# Patient Record
Sex: Female | Born: 1940 | Race: White | Hispanic: No | Smoking: Former smoker
Health system: Southern US, Community
[De-identification: ages and names within clinical notes are randomized; demographics above are authoritative.]

## PROBLEM LIST (undated history)

## (undated) DIAGNOSIS — M8580 Other specified disorders of bone density and structure, unspecified site: Secondary | ICD-10-CM

## (undated) DIAGNOSIS — Z8719 Personal history of other diseases of the digestive system: Secondary | ICD-10-CM

## (undated) DIAGNOSIS — M810 Age-related osteoporosis without current pathological fracture: Secondary | ICD-10-CM

## (undated) DIAGNOSIS — Z87440 Personal history of urinary (tract) infections: Secondary | ICD-10-CM

## (undated) DIAGNOSIS — J189 Pneumonia, unspecified organism: Secondary | ICD-10-CM

## (undated) DIAGNOSIS — Q2381 Bicuspid aortic valve: Secondary | ICD-10-CM

## (undated) DIAGNOSIS — E213 Hyperparathyroidism, unspecified: Secondary | ICD-10-CM

## (undated) DIAGNOSIS — I10 Essential (primary) hypertension: Secondary | ICD-10-CM

## (undated) DIAGNOSIS — E785 Hyperlipidemia, unspecified: Secondary | ICD-10-CM

## (undated) DIAGNOSIS — I77819 Aortic ectasia, unspecified site: Secondary | ICD-10-CM

## (undated) DIAGNOSIS — E049 Nontoxic goiter, unspecified: Secondary | ICD-10-CM

## (undated) DIAGNOSIS — M069 Rheumatoid arthritis, unspecified: Secondary | ICD-10-CM

## (undated) DIAGNOSIS — M1990 Unspecified osteoarthritis, unspecified site: Secondary | ICD-10-CM

## (undated) DIAGNOSIS — Q231 Congenital insufficiency of aortic valve: Secondary | ICD-10-CM

## (undated) HISTORY — DX: Hyperlipidemia, unspecified: E78.5

## (undated) HISTORY — PX: OTHER SURGICAL HISTORY: SHX169

## (undated) HISTORY — DX: Essential (primary) hypertension: I10

## (undated) HISTORY — DX: Age-related osteoporosis without current pathological fracture: M81.0

## (undated) HISTORY — PX: BREAST BIOPSY: SHX20

---

## 2017-10-07 LAB — BASIC METABOLIC PANEL
BUN: 16 (ref 4–21)
CREATININE: 1 (ref <=1.1)

## 2017-10-07 LAB — LIPID PANEL
CHOLESTEROL: 108 (ref 0–200)
HDL: 50 (ref 35–70)
LDL Cholesterol: 40
Triglycerides: 101 (ref 40–160)

## 2017-10-09 ENCOUNTER — Ambulatory Visit: Payer: Self-pay | Admitting: "Endocrinology

## 2017-11-06 ENCOUNTER — Ambulatory Visit: Payer: Self-pay | Admitting: "Endocrinology

## 2017-11-29 ENCOUNTER — Encounter: Payer: Self-pay | Admitting: "Endocrinology

## 2017-11-29 ENCOUNTER — Ambulatory Visit (INDEPENDENT_AMBULATORY_CARE_PROVIDER_SITE_OTHER): Payer: Medicare Other | Admitting: "Endocrinology

## 2017-11-29 VITALS — BP 121/73 | HR 58 | Ht 62.0 in | Wt 181.0 lb

## 2017-11-29 DIAGNOSIS — E210 Primary hyperparathyroidism: Secondary | ICD-10-CM | POA: Insufficient documentation

## 2017-11-29 DIAGNOSIS — R635 Abnormal weight gain: Secondary | ICD-10-CM | POA: Insufficient documentation

## 2017-11-29 NOTE — Progress Notes (Signed)
Consult Note       11/29/2017, 12:38 PM  April Haas is a 77 y.o.-year-old female, referred by her  rheumatologist Dr. Octaviano GlowShroff.  for evaluation for hypercalcemia/hyperparathyroidism.   Past Medical History:  Diagnosis Date  . Hyperlipidemia   . Hypertension   . Osteoporosis     Past Surgical History:  Procedure Laterality Date  . BREAST BIOPSY    . ovarion cyst      Social History   Tobacco Use  . Smoking status: Former Smoker    Packs/day: 1.00    Years: 30.00    Pack years: 30.00    Last attempt to quit: 11/29/1992    Years since quitting: 25.0  . Smokeless tobacco: Never Used  Substance Use Topics  . Alcohol use: Yes    Comment: Occasional  . Drug use: No    Outpatient Encounter Medications as of 11/29/2017  Medication Sig  . allopurinol (ZYLOPRIM) 300 MG tablet Take 300 mg by mouth daily.  Marland Kitchen. amLODipine (NORVASC) 5 MG tablet Take 5 mg by mouth daily.  Marland Kitchen. atorvastatin (LIPITOR) 20 MG tablet Take 20 mg by mouth daily.  . benazepril (LOTENSIN) 40 MG tablet Take 40 mg by mouth daily.  . Cholecalciferol (VITAMIN D3) 2000 units TABS Take by mouth.  . folic acid (FOLVITE) 1 MG tablet Take 1 mg by mouth daily.  . hydrALAZINE (APRESOLINE) 100 MG tablet Take 100 mg by mouth 2 (two) times daily.  . methotrexate (RHEUMATREX) 15 MG tablet Take 15 mg by mouth once a week. Caution: Chemotherapy. Protect from light.  . metoprolol succinate (TOPROL-XL) 25 MG 24 hr tablet Take 25 mg by mouth daily.  . Omega-3 Fatty Acids (FISH OIL) 1000 MG CPDR Take by mouth 2 (two) times daily.  Marland Kitchen. omeprazole (PRILOSEC) 20 MG capsule Take 20 mg by mouth daily.  . traMADol (ULTRAM) 50 MG tablet Take by mouth every 6 (six) hours as needed.   No facility-administered encounter medications on file as of 11/29/2017.     No Known Allergies   HPI  April Haas was found to have several instances of elevated calcium, the highest being 10.9 on  10/07/2017. On 09/10/2017 she was found to have high calcium of 10.4 associated with high PTH of 66.   - On further interview patient reports that 6 years prior she was found to have similar problem with her calcium was evaluated and put on a observation by an endocrinologist with no definitive treatment.  - She is following with her rheumatologist Dr. Octaviano GlowShroff  for rheumatoid arthritis, when she was found to have the above calcium abnormalities. - She gives history of osteoporosis, treated with at least 3 yearly injections of Reclast in the past, her most recent bone density study on 09/12/2017 showed normal femur T score of -2,  Reportedly significant improvement from her prior studies (not available for me to review today) . - But this is still consistent with osteopenia in a patient with history of osteoporosis in the past. She denies any history of fragility fractures. She admits to loss of less than half-inch of height over the years, but denies any history of compression fractures  of the spine.  I reviewed pt's pertinent labs:  I reviewed pt's DEXA scans: Date L1-L4 T score FN T score 33% distal Radius  09/02/2017 -0.1 -2        No fractures or falls.   No h/o kidney stones.  No h/o CKD. Last BUN/Cr: 16/1.00 Pt is not on HCTZ. - She has  h/o vitamin D deficiency, currently on over-the-counter vitamin D 3 2000 units daily. Pt is not on calcium supplements; she also eats dairy and green, leafy, vegetables. her intake of dairy products is average.  Pt does not have a FH of hypercalcemia, pituitary tumors, thyroid cancer, or osteoporosis. She is a former smoker 30-35 PY.  ROS:  Constitutional:  +  Reports progressive weight gain,  no fatigue, no subjective hyperthermia, no subjective hypothermia Eyes: no blurry vision, no xerophthalmia ENT: no sore throat, no nodules palpated in throat, no dysphagia/odynophagia, no hoarseness Cardiovascular: no Chest Pain, no Shortness of Breath, no  palpitations, no leg swelling Respiratory: no cough, no SOB Gastrointestinal: no Nausea/Vomiting/Diarhhea Musculoskeletal: no muscle/joint aches Skin: no rashes Neurological: no tremors, no numbness, no tingling, no dizziness Psychiatric: no depression, no anxiety  PE: BP 121/73   Pulse (!) 58   Ht 5\' 2"  (1.575 m)   Wt 181 lb (82.1 kg)   BMI 33.11 kg/m  Wt Readings from Last 3 Encounters:  11/29/17 181 lb (82.1 kg)   Constitutional: + obese, not in acute distress, normal state of mind Eyes: PERRLA, EOMI, no exophthalmos ENT: moist mucous membranes, + palpable thyromegaly, no cervical lymphadenopathy Cardiovascular: normal precordial activity, Regular Rate and Rhythm, no Murmur/Rubs/Gallops Respiratory:  adequate breathing efforts, no gross chest deformity, Clear to auscultation bilaterally Gastrointestinal: abdomen soft, Non -tender, No distension, Bowel Sounds present Musculoskeletal: no gross deformities, strength intact in all four extremities Skin: moist, warm, no rashes Neurological: no tremor with outstretched hands, Deep tendon reflexes normal in all four extremities.   September 2018: Calcium 10.7, October 2018 calcium 10.4, PTH 66 (15-64); 10/07/2017 calcium 10.9    Assessment: 1. Hypercalcemia/hyperparathyroidism 2. Clinical goiter 3. Progressive weight gain  Plan: Patient has had several instances of elevated calcium, with the highest level being at 10.9. A corresponding intact PTH level was also high, at 66.  - Patient also  has vitamin D deficiency, currently on ongoing vitamin D supplement. - She gives prior history of what appears to be mild hyperparathyroidism, put on observation when she was 77 years old.  - She has an apparent complications from hypercalcemia: h/o osteoporosis, however denies history of nephrolithiasis nor fragility fractures, although there is a history of loss of half inch of height. No abdominal pain , depression, bone pain.  - I  discussed with the patient about the physiology of calcium and parathyroid hormone, and possible side effects from increased PTH, including kidney stones, cardiac dysrhythmias, worsening osteoporosis, abdominal pain,  etc.   - she needs more studies to classify the parathyroid dysfunction she may have. I will proceed to obtain serum magnesium, phosphorus, repeat intact PTH/calcium. It is also essential to obtain 24-hour urine calcium/creatinine to rule out the rare but important cause of mild elevation in calcium and PTH- FHH ( Familial Hypocalciuric Hypercalcemia). Rule out secondary causes of hypercalcemia, I have added thyroid function test before her next labs.  -  given her clinical goiter, I have ordered thyroid/neck ultrasound. -  she will return in 4 weeks for reevaluation.  -  given her history of prior osteoporosis and current osteopenia,  she may continue to benefit from pharmacologic therapy with bisphosphonates. This will be discussed during her next visit.  - Return in about 4 weeks (around 12/27/2017) for 24 Hours Urine Calcium, labs today, DXA report.   Marquis Lunch, MD [facility] [address] [place], [place] [address] Phone: [phone]  Fax: [phone]    This note was partially dictated with voice recognition software. Similar sounding words can be transcribed inadequately or may not  be corrected upon review.  11/29/2017, 12:38 PM

## 2017-12-02 LAB — PTH, INTACT AND CALCIUM
Calcium: 10.3 mg/dL (ref 8.6–10.4)
PTH: 107 pg/mL — ABNORMAL HIGH (ref 14–64)

## 2017-12-02 LAB — TSH: TSH: 3.74 mIU/L (ref 0.40–4.50)

## 2017-12-02 LAB — T4, FREE: Free T4: 1.1 ng/dL (ref 0.8–1.8)

## 2017-12-02 LAB — MAGNESIUM: Magnesium: 1.9 mg/dL (ref 1.5–2.5)

## 2017-12-02 LAB — PHOSPHORUS: Phosphorus: 2.3 mg/dL (ref 2.1–4.3)

## 2017-12-07 LAB — CALCIUM, URINE, 24 HOUR: Calcium, 24H Urine: 221 mg/24 h

## 2017-12-27 ENCOUNTER — Ambulatory Visit (INDEPENDENT_AMBULATORY_CARE_PROVIDER_SITE_OTHER): Payer: Medicare Other | Admitting: "Endocrinology

## 2017-12-27 ENCOUNTER — Encounter: Payer: Self-pay | Admitting: "Endocrinology

## 2017-12-27 VITALS — BP 118/72 | HR 60 | Ht 62.0 in | Wt 179.0 lb

## 2017-12-27 DIAGNOSIS — E210 Primary hyperparathyroidism: Secondary | ICD-10-CM | POA: Diagnosis not present

## 2017-12-27 DIAGNOSIS — M85859 Other specified disorders of bone density and structure, unspecified thigh: Secondary | ICD-10-CM | POA: Diagnosis not present

## 2017-12-27 NOTE — Progress Notes (Signed)
Consult Note       12/27/2017, 12:51 PM  April Haas is a 77 y.o.-year-old female, referred by her  rheumatologist Dr. Octaviano GlowShroff.  for evaluation for hypercalcemia/hyperparathyroidism.   Past Medical History:  Diagnosis Date  . Hyperlipidemia   . Hypertension   . Osteoporosis     Past Surgical History:  Procedure Laterality Date  . BREAST BIOPSY    . ovarion cyst      Social History   Tobacco Use  . Smoking status: Former Smoker    Packs/day: 1.00    Years: 30.00    Pack years: 30.00    Last attempt to quit: 11/29/1992    Years since quitting: 25.0  . Smokeless tobacco: Never Used  Substance Use Topics  . Alcohol use: Yes    Comment: Occasional  . Drug use: No    Outpatient Encounter Medications as of 12/27/2017  Medication Sig  . allopurinol (ZYLOPRIM) 300 MG tablet Take 300 mg by mouth daily.  Marland Kitchen. amLODipine (NORVASC) 5 MG tablet Take 5 mg by mouth daily.  Marland Kitchen. atorvastatin (LIPITOR) 20 MG tablet Take 20 mg by mouth daily.  . benazepril (LOTENSIN) 40 MG tablet Take 40 mg by mouth daily.  . Cholecalciferol (VITAMIN D3) 2000 units TABS Take by mouth.  . folic acid (FOLVITE) 1 MG tablet Take 1 mg by mouth daily.  . hydrALAZINE (APRESOLINE) 100 MG tablet Take 100 mg by mouth 2 (two) times daily.  . methotrexate (RHEUMATREX) 15 MG tablet Take 15 mg by mouth once a week. Caution: Chemotherapy. Protect from light.  . metoprolol succinate (TOPROL-XL) 25 MG 24 hr tablet Take 25 mg by mouth daily.  . Omega-3 Fatty Acids (FISH OIL) 1000 MG CPDR Take by mouth 2 (two) times daily.  Marland Kitchen. omeprazole (PRILOSEC) 20 MG capsule Take 20 mg by mouth daily.  . traMADol (ULTRAM) 50 MG tablet Take by mouth every 6 (six) hours as needed.   No facility-administered encounter medications on file as of 12/27/2017.     No Known Allergies   HPI  April SkeensLinda Haas was found to have several instances of elevated calcium, the highest being 10.9 on  10/07/2017. On 09/10/2017 she was found to have high calcium of 10.4 associated with high PTH of 66.  She is returning with repeat labs including PTH which is now higher at 107, associated with calcium of 10.3. -24-hour urine calcium is 221.  Review of her bone density reveals osteopenia, T score -2.0 on femoral neck.  - On further interview patient reports that 6 years prior she was found to have similar problem with her calcium,  was evaluated and put on a observation by an endocrinologist with no definitive treatment.  - She is following with her rheumatologist Dr. Octaviano GlowShroff  for rheumatoid arthritis, when she was found to have the above calcium abnormalities. - She gives history of osteoporosis, treated with at least 3 yearly injections of Reclast in the past, her most recent bone density study on 09/12/2017 showed  low femur T score of -2,  Reportedly significant improvement from her prior studies (not available for me to review today) . -  She denies any  history of fragility fractures. She admits to loss of half-inch of height over the years, but denies any history of compression fractures of the spine.  I reviewed pt's pertinent labs:  I reviewed pt's DEXA scans: Date L1-L4 T score FN T score 33% distal Radius  09/02/2017 -0.1 -2        No fractures or falls.   No h/o kidney stones.  No h/o CKD. Last BUN/Cr: 16/1.00 Pt is not on HCTZ. - She has  h/o vitamin D deficiency, currently on over-the-counter vitamin D 3 2000 units daily. Pt is not on calcium supplements; she also eats dairy and green, leafy, vegetables. her intake of dairy products is average.  Pt does not have a FH of hypercalcemia, pituitary tumors, thyroid cancer, or osteoporosis. She is a former smoker 30-35 PY.  ROS:  Constitutional:  +  Reports progressive weight gain,  no fatigue, no subjective hyperthermia, no subjective hypothermia Eyes: no blurry vision, no xerophthalmia ENT: no sore throat, no nodules palpated in  throat, no dysphagia/odynophagia, no hoarseness Cardiovascular: no Chest Pain, no Shortness of Breath, no palpitations, no leg swelling Respiratory: no cough, no SOB Gastrointestinal: no Nausea/Vomiting/Diarhhea Musculoskeletal: no muscle/joint aches Skin: no rashes Neurological: no tremors, no numbness, no tingling, no dizziness Psychiatric: no depression, no anxiety  PE: BP 118/72   Pulse 60   Ht 5\' 2"  (1.575 m)   Wt 179 lb (81.2 kg)   BMI 32.74 kg/m  Wt Readings from Last 3 Encounters:  12/27/17 179 lb (81.2 kg)  11/29/17 181 lb (82.1 kg)   Constitutional: + obese, not in acute distress, normal state of mind Eyes: PERRLA, EOMI, no exophthalmos ENT: moist mucous membranes, + palpable thyromegaly, no cervical lymphadenopathy Cardiovascular: normal precordial activity, Regular Rate and Rhythm, no Murmur/Rubs/Gallops Respiratory:  adequate breathing efforts, no gross chest deformity, Clear to auscultation bilaterally Gastrointestinal: abdomen soft, Non -tender, No distension, Bowel Sounds present Musculoskeletal: no gross deformities, strength intact in all four extremities Skin: moist, warm, no rashes Neurological: no tremor with outstretched hands, Deep tendon reflexes normal in all four extremities.   September 2018: Calcium 10.7, October 2018 calcium 10.4, PTH 66 (15-64); 10/07/2017 calcium 10.9   Recent Results (from the past 2160 hour(s))  Basic metabolic panel     Status: None   Collection Time: 10/07/17 12:00 AM  Result Value Ref Range   BUN 16 4 - 21   Creatinine 1.0 0.5 - 1.1  Lipid panel     Status: None   Collection Time: 10/07/17 12:00 AM  Result Value Ref Range   Triglycerides 101 40 - 160   Cholesterol 108 0 - 200   HDL 50 35 - 70   LDL Cholesterol 40   PTH, intact and calcium     Status: Abnormal   Collection Time: 11/29/17 11:06 AM  Result Value Ref Range   PTH 107 (H) 14 - 64 pg/mL    Comment: . Interpretive Guide    Intact PTH            Calcium ------------------    ----------           ------- Normal Parathyroid    Normal               Normal Hypoparathyroidism    Low or Low Normal    Low Hyperparathyroidism    Primary            Normal or High       High  Secondary          High                 Normal or Low    Tertiary           High                 High Non-Parathyroid    Hypercalcemia      Low or Low Normal    High .    Calcium 10.3 8.6 - 10.4 mg/dL  Magnesium     Status: None   Collection Time: 11/29/17 11:06 AM  Result Value Ref Range   Magnesium 1.9 1.5 - 2.5 mg/dL  Phosphorus     Status: None   Collection Time: 11/29/17 11:06 AM  Result Value Ref Range   Phosphorus 2.3 2.1 - 4.3 mg/dL  T4, free     Status: None   Collection Time: 11/29/17 11:06 AM  Result Value Ref Range   Free T4 1.1 0.8 - 1.8 ng/dL  TSH     Status: None   Collection Time: 11/29/17 11:06 AM  Result Value Ref Range   TSH 3.74 0.40 - 4.50 mIU/L  Calcium, urine, 24 hour     Status: None   Collection Time: 12/06/17 11:15 AM  Result Value Ref Range   Calcium, 24H Urine 221 mg/24 h    Comment:                           Reference Range  35-250                            Low calcium diet 35-200     Assessment: 1. Hypercalcemia/hyperparathyroidism 2. Clinical goiter 3. Progressive weight gain  Plan: Patient's current labs show normal calcium of 10.3 associated with high PTH of 107, 24-hour urine calcium of 221.  History of prior osteoporosis status post therapy with Reclast times 3 years, most recent bone density shows T score of -2.0  femoral neck. - Patient also  has vitamin D deficiency, currently on ongoing vitamin D supplement. - She gives prior history of what appears to be mild hyperparathyroidism, put on observation when she was 77 years old.  - She denies history of nephrolithiasis nor fragility fractures, although there is a history of loss of half inch of height. No abdominal pain , depression, bone pain. -Her referral  package includes mildly depressed GFR of 55. -She qualifies for surgical intervention at this time. -Although she is in her 74s, she is believed to be fit for surgery. -She is somewhat hesitant, however accepting to consult with Dr. Gerrit Friends. -If she does not accept surgery, she will be considered for Sensipar therapy.   -  given her clinical goiter, I have ordered thyroid/neck ultrasound. -  she will return in 12 weeks for reevaluation. -  given her history of prior osteoporosis and current osteopenia, she may continue to benefit from pharmacologic therapy with bisphosphonates. This will be discussed during her next visit.  - Return in about 3 months (around 03/26/2018) for follow up with labs after surgery.   Marquis Lunch, MD [facility] [address] [place], [place] [address] Phone: [phone]  Fax: [phone]    This note was partially dictated with voice recognition software. Similar sounding words can be transcribed inadequately or may not  be corrected upon review.  12/27/2017, 12:51 PM

## 2018-01-30 ENCOUNTER — Other Ambulatory Visit (HOSPITAL_COMMUNITY): Payer: Self-pay | Admitting: Surgery

## 2018-01-30 DIAGNOSIS — E210 Primary hyperparathyroidism: Secondary | ICD-10-CM

## 2018-02-17 ENCOUNTER — Encounter (HOSPITAL_COMMUNITY): Admission: RE | Admit: 2018-02-17 | Payer: Medicare Other | Source: Ambulatory Visit

## 2018-02-17 ENCOUNTER — Encounter (HOSPITAL_COMMUNITY)
Admission: RE | Admit: 2018-02-17 | Discharge: 2018-02-17 | Disposition: A | Discharge status: Home / Self Care | Payer: Medicare Other | Source: Ambulatory Visit | Attending: Surgery | Admitting: Surgery

## 2018-02-17 DIAGNOSIS — E210 Primary hyperparathyroidism: Secondary | ICD-10-CM | POA: Insufficient documentation

## 2018-02-17 MED ORDER — TECHNETIUM TC 99M SESTAMIBI - CARDIOLITE
26.1000 | Freq: Once | INTRAVENOUS | Status: AC | PRN
  Administered 2018-02-17: 13:00:00 26.1 via INTRAVENOUS

## 2018-02-24 ENCOUNTER — Ambulatory Visit: Payer: Self-pay | Admitting: Surgery

## 2018-02-28 ENCOUNTER — Encounter (HOSPITAL_COMMUNITY): Payer: Self-pay

## 2018-02-28 NOTE — Pre-Procedure Instructions (Signed)
Last office visit note Dr. Fransico HimNida 12/27/17 in epic.

## 2018-02-28 NOTE — Patient Instructions (Signed)
Your procedure is scheduled on: Tomorrow, March 04, 2018   Surgery Time: 7:30AM-8:45AM   [facility] Main  Entr[facility]   Arrive by 5:30 AM pick up the phone at the front desk dial [phone], have a seat in the lobby and a staff member will come and escort you to Short Stay Department   Call this number if you have problems the morning of surgery [phone]   Do not eat food or drink liquids :After Midnight.   Do NOT smoke after Midnight   Take these medicines the morning of surgery with A SIP OF WATER: Allopurinol, Hydralazine, Omeprazole   Bring Asthma Inhaler day of surgery                               You may not have any metal on your body including hair pins, jewelry, and body piercings             Do not wear make-up, lotions, powders, perfumes/cologne, or deodorant             Do not wear nail polish.  Do not shave  48 hours prior to surgery.              Do not bring valuables to the hospital. [facility] IS NOT             RESPONSIBLE   FOR VALUABLES.   Contacts, dentures or bridgework may not be worn into surgery.   Leave suitcase in the car. After surgery it may be brought to your room.     Special Instructions: Bring a copy of your healthcare power of attorney and living will documents         the day of surgery if you haven't scanned them in before.              Please read over the following fact sheets you were given:  [facility] - Preparing for [facility] Before surgery, you can play an important role.  Because skin is not sterile, your skin needs to be as free of germs as possible.  You can reduce the number of germs on your skin by washing with CHG (chlorahexidine gluconate) soap before surgery.  CHG is an antiseptic cleaner which kills germs and bonds with the skin to continue killing germs even after washing. Please DO NOT use if you have an allergy to CHG or antibacterial soaps.  If your skin becomes reddened/irritated stop using the CHG  and inform your nurse when you arrive at Short Stay. Do not shave (including legs and underarms) for at least 48 hours prior to the first CHG shower.  You may shave your face/neck.  Please follow these instructions carefully:  1.  Shower with CHG Soap the night before surgery and the  morning of surgery.  2.  If you choose to wash your hair, wash your hair first as usual with your normal  shampoo.  3.  After you shampoo, rinse your hair and body thoroughly to remove the shampoo.                             4.  Use CHG as you would any other liquid soap.  You can apply chg directly to the skin and wash.  Gently with a scrungie or clean washcloth.  5.  Apply the CHG Soap to  your body ONLY FROM THE NECK DOWN.   Do   not use on face/ open                           Wound or open sores. Avoid contact with eyes, ears mouth and   genitals (private parts).                       Wash face,  Genitals (private parts) with your normal soap.             6.  Wash thoroughly, paying special attention to the area where your    surgery  will be performed.  7.  Thoroughly rinse your body with warm water from the neck down.  8.  DO NOT shower/wash with your normal soap after using and rinsing off the CHG Soap.                9.  Pat yourself dry with a clean towel.            10.  Wear clean pajamas.            11.  Place clean sheets on your bed the night of your first shower and do not  sleep with pets. Day of Surgery : Do not apply any lotions/deodorants the morning of surgery.  Please wear clean clothes to the hospital/surgery center.  FAILURE TO FOLLOW THESE INSTRUCTIONS MAY RESULT IN THE CANCELLATION OF YOUR SURGERY  PATIENT SIGNATURE_________________________________  NURSE SIGNATURE__________________________________  ________________________________________________________________________

## 2018-03-02 ENCOUNTER — Encounter (HOSPITAL_COMMUNITY): Payer: Self-pay | Admitting: Surgery

## 2018-03-02 NOTE — H&P (Signed)
General Surgery [facility]  April Haas DOB: 13-Jul-1941 Widowed / Language: [place] / Race: White Female  History of Present Illness  The patient is a 77 year old female who presents with primary hyperparathyroidism.  CC: primary hyperparathyroidism  Patient is referred by Dr. Maryella Shivers for surgical evaluation and management of suspected primary hyperparathyroidism. Patient has been noted for 5-6 years to have hypercalcemia. She underwent evaluation in [place], [place]. Those results are not available. Her endocrinologist moved away from the community and the patient never underwent additional evaluation until recently. She is now seeing Dr. Fransico Him and had laboratory studies and a bone density scan performed. Bone density scanning in 2018 shows osteopenia. Serum calcium levels range from 10.3-10.5. Intact PTH level is elevated at 107. 24-hour urine calcium is normal at 221. Patient does take vitamin D supplementation. She has been getting injections of Reclast. Patient has taken thyroid hormone in the past. She has had no prior head or neck surgery. There is no family history of parathyroid disease or other endocrine neoplasm. She is a retired Engineer, civil (consulting). She is widowed. She denies any fatigue. She has no history of nephrolithiasis.   Past Surgical History Appendectomy  Breast Biopsy  Right. Breast Mass; Local Excision  Right. Oral Surgery   Diagnostic Studies History Colonoscopy  5-10 years ago Mammogram  within last year Pap Smear  >5 years ago  Allergies  No Known Drug Allergies [01/29/2018]: Allergies Reconciled   Medication History Benazepril HCl (40MG  Tablet, Oral) Active. Omeprazole (20MG  Tablet DR, Oral) Active. HydrALAZINE HCl (100MG  Tablet, Oral) Active. Metoprolol Succinate ER (25MG  Tablet ER 24HR, Oral) Active. Allopurinol (300MG  Tablet, Oral) Active. Methotrexate Sodium (2.5MG  Tablet, Oral) Active. Atorvastatin  Calcium (20MG  Tablet, Oral) Active. AmLODIPine Besylate (5MG  Tablet, Oral) Active. TraMADol HCl (50MG  Tablet, Oral) Active. Ventolin HFA (108 (90 Base)MCG/ACT Aerosol Soln, Inhalation) Active. Vitamin D (2000UNIT Tablet, Oral) Active. Fish Oil (1000MG  Capsule, Oral) Active. Vitamin B12 ( Tablet, Oral) Active. Medications Reconciled  Social History Alcohol use  Occasional alcohol use. Caffeine use  Carbonated beverages, Coffee, Tea. No drug use  Tobacco use  Former smoker.  Family History  Arthritis  Mother, Sister. Cancer  Brother. Diabetes Mellitus  Brother, Father, Mother, Sister. Heart Disease  Father. Hypertension  Brother, Father, Mother, Sister. Respiratory Condition  Father.  Pregnancy / Birth History Age at menarche  12 years. Age of menopause  17-55 Gravida  0 Para  0  Other Problems  Arthritis  Atrial Fibrillation  Back Pain  Gastroesophageal Reflux Disease  Heart murmur  High blood pressure  Hypercholesterolemia  Melanoma  Other disease, cancer, significant illness   Review of Systems General Not Present- Appetite Loss, Chills, Fatigue, Fever, Night Sweats, Weight Gain and Weight Loss. Skin Present- Dryness. Not Present- Change in Wart/Mole, Hives, Jaundice, New Lesions, Non-Healing Wounds, Rash and Ulcer. HEENT Present- Wears glasses/contact lenses. Not Present- Earache, Hearing Loss, Hoarseness, Nose Bleed, Oral Ulcers, Ringing in the Ears, Seasonal Allergies, Sinus Pain, Sore Throat, Visual Disturbances and Yellow Eyes. Respiratory Present- Snoring. Not Present- Bloody sputum, Chronic Cough, Difficulty Breathing and Wheezing. Breast Not Present- Breast Mass, Breast Pain, Nipple Discharge and Skin Changes. Cardiovascular Present- Shortness of Breath. Not Present- Chest Pain, Difficulty Breathing Lying Down, Leg Cramps, Palpitations, Rapid Heart Rate and Swelling of Extremities. Gastrointestinal Not Present- Abdominal  Pain, Bloating, Bloody Stool, Change in Bowel Habits, Chronic diarrhea, Constipation, Difficulty Swallowing, Excessive gas, Gets full quickly at meals, Hemorrhoids, Indigestion, Nausea, Rectal Pain and Vomiting. Female  Genitourinary Not Present- Frequency, Nocturia, Painful Urination, Pelvic Pain and Urgency. Musculoskeletal Present- Joint Pain and Joint Stiffness. Not Present- Back Pain, Muscle Pain, Muscle Weakness and Swelling of Extremities. Neurological Not Present- Decreased Memory, Fainting, Headaches, Numbness, Seizures, Tingling, Tremor, Trouble walking and Weakness. Psychiatric Not Present- Anxiety, Bipolar, Change in Sleep Pattern, Depression, Fearful and Frequent crying. Endocrine Not Present- Cold Intolerance, Excessive Hunger, Hair Changes, Heat Intolerance, Hot flashes and New Diabetes. Hematology Not Present- Blood Thinners, Easy Bruising, Excessive bleeding, Gland problems, HIV and Persistent Infections.  Vitals Weight: 176 lb Height: 62.5in Body Surface Area: 1.82 m Body Mass Index: 31.68 kg/m  Temp.: 98.35F  Pulse: 87 (Regular)  BP: 120/76 (Sitting, Left Arm, Standard)  Physical Exam  See vital signs recorded above  GENERAL APPEARANCE Development: normal Nutritional status: normal Gross deformities: none  SKIN Rash, lesions, ulcers: none Induration, erythema: none Nodules: none palpable  EYES Conjunctiva and lids: normal Pupils: equal and reactive Iris: normal bilaterally  EARS, NOSE, MOUTH, THROAT External ears: no lesion or deformity External nose: no lesion or deformity Hearing: grossly normal Lips: no lesion or deformity Dentition: normal for age Oral mucosa: moist  NECK Symmetric: yes Trachea: midline Thyroid: no palpable nodules in the thyroid bed  CHEST Respiratory effort: normal Retraction or accessory muscle use: no Breath sounds: normal bilaterally Rales, rhonchi, wheeze: none  CARDIOVASCULAR Auscultation: regular rhythm,  normal rate Murmurs: none Pulses: carotid and radial pulse 2+ palpable Lower extremity edema: none Lower extremity varicosities: none  MUSCULOSKELETAL Station and gait: normal Digits and nails: no clubbing or cyanosis Muscle strength: grossly normal all extremities Range of motion: grossly normal all extremities Deformity: none  LYMPHATIC Cervical: none palpable Supraclavicular: none palpable  PSYCHIATRIC Oriented to person, place, and time: yes Mood and affect: normal for situation Judgment and insight: appropriate for situation    Assessment & Plan  PRIMARY HYPERPARATHYROIDISM (E21.0)  Pt Education - Pamphlet Given - The Parathyroid Surgery Book: discussed with patient and provided information. Follow Up - Call [facility] office after tests / studies doneto discuss further plans  Patient presents on referral from her endocrinologist for surgical evaluation of suspected primary hyperparathyroidism. She is provided with written literature to review at home.  Patient has signs and symptoms of primary hyperparathyroidism. She has not had any imaging studies performed. I have recommended proceeding with nuclear medicine parathyroid scan. We will make arrangements for the study in the near future. If it is positive, then I believe the patient will be a good candidate for minimally invasive surgery. We discussed the procedure today. We discussed the hospital stay to be anticipated. Since she lives alone out of town in [place], [place], I may recommend that she stay overnight following her procedure.  If the nuclear medicine parathyroid scan does not localize a parathyroid adenoma, then we will obtain a high-resolution ultrasound scan in hopes of identifying the adenoma. If this is unsuccessful, we will consider a 4D CT scan in hopes of identifying the location of a parathyroid adenoma.  Patient will undergo nuclear scanning. We will contact her with her results as soon as they  are available.  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  Plan minimally invasive surgery.  The risks and benefits of the procedure have been discussed at length with the patient.  The patient understands the proposed procedure, potential alternative treatments, and the course of recovery to be expected.  All of the patient's questions have been answered at this time.  The patient wishes  to proceed with surgery.  Darnell Level, MD [facility] Office: [phone]

## 2018-03-03 ENCOUNTER — Encounter (HOSPITAL_COMMUNITY): Payer: Self-pay | Admitting: Anesthesiology

## 2018-03-03 ENCOUNTER — Inpatient Hospital Stay (HOSPITAL_COMMUNITY)
Admission: RE | Admit: 2018-03-03 | Discharge: 2018-03-03 | Disposition: A | Discharge status: Home / Self Care | Payer: Medicare Other | Source: Ambulatory Visit

## 2018-03-03 HISTORY — DX: Rheumatoid arthritis, unspecified: M06.9

## 2018-03-03 HISTORY — DX: Other specified disorders of bone density and structure, unspecified site: M85.80

## 2018-03-03 HISTORY — DX: Hypercalcemia: E83.52

## 2018-03-03 HISTORY — DX: Unspecified osteoarthritis, unspecified site: M19.90

## 2018-03-03 HISTORY — DX: Personal history of urinary (tract) infections: Z87.440

## 2018-03-03 HISTORY — DX: Nontoxic goiter, unspecified: E04.9

## 2018-03-03 HISTORY — DX: Pneumonia, unspecified organism: J18.9

## 2018-03-03 HISTORY — DX: Aortic ectasia, unspecified site: I77.819

## 2018-03-03 HISTORY — DX: Hyperparathyroidism, unspecified: E21.3

## 2018-03-03 HISTORY — DX: Personal history of other diseases of the digestive system: Z87.19

## 2018-03-03 HISTORY — DX: Bicuspid aortic valve: Q23.81

## 2018-03-03 HISTORY — DX: Congenital insufficiency of aortic valve: Q23.1

## 2018-03-04 ENCOUNTER — Encounter (HOSPITAL_COMMUNITY): Admission: RE | Payer: Self-pay | Source: Ambulatory Visit

## 2018-03-04 ENCOUNTER — Ambulatory Visit (HOSPITAL_COMMUNITY): Admission: RE | Admit: 2018-03-04 | Payer: Medicare Other | Source: Ambulatory Visit | Admitting: Surgery

## 2018-03-04 SURGERY — PARATHYROIDECTOMY
Anesthesia: General | Laterality: Right

## 2018-03-27 ENCOUNTER — Ambulatory Visit: Payer: Medicare Other | Admitting: "Endocrinology

## 2019-10-11 IMAGING — NM NM PARATHYROID W/ SPECT
3 series · 18 of 18 positions shown · non-contrast
Comparison: None

CLINICAL DATA: Primary hyperparathyroidism, hypercalcemia

EXAM:
NM PARATHYROID SCINTIGRAPHY AND SPECT IMAGING
TECHNIQUE: Following intravenous administration of radiopharmaceutical, early
and 2-hour delayed planar images were obtained in the anterior
projection. Delayed triplanar SPECT images were also obtained at 2
hours.
RADIOPHARMACEUTICALS:  26.1 mCi 6c-GGm Sestamibi IV

[Series 1: spect - (id)_(id)_cor · 4.1mm · 4.14mm/px · 6 of 128 frames shown]
[frame 11/128]
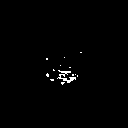
[frame 32/128]
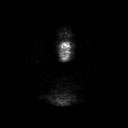
[frame 54/128]
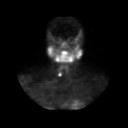
[frame 75/128]
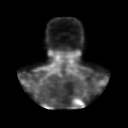
[frame 96/128]
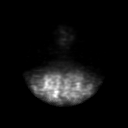
[frame 118/128]
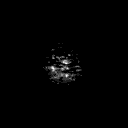

[Series 1: spect - (id)_(id)_tra · 4.1mm · 4.14mm/px · 6 of 128 frames shown]
[frame 11/128]
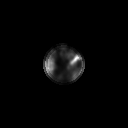
[frame 32/128]
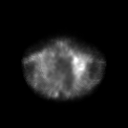
[frame 54/128]
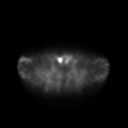
[frame 75/128]
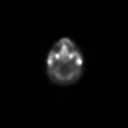
[frame 96/128]
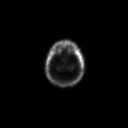
[frame 118/128]
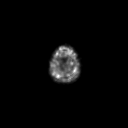

[Series 2: spect parathyroid · 4.14mm/px · 6 of 64 frames shown]
[frame 6/64  full-range]
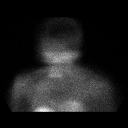
[frame 16/64  full-range]
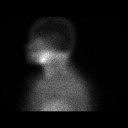
[frame 27/64  full-range]
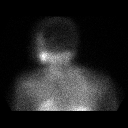
[frame 38/64  full-range]
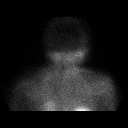
[frame 48/64  full-range]
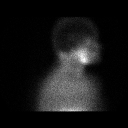
[frame 59/64  full-range]
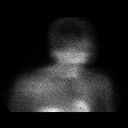

[18 of 18 positions shown; findings below may reference images not displayed]

FINDINGS: Planar images: Asymmetric initial tracer distribution within the
thyroid lobes RIGHT greater than LEFT. Normal washout of tracer from
thyroid tissue. Focal area of abnormal sestamibi retention at the
expected position of the RIGHT inferior parathyroid gland.

SPECT imaging: Focal intense increased tracer localization at the
expected position of the inferior RIGHT parathyroid gland consistent
with parathyroid adenoma. No abnormal sestamibi retention at the
sites of the remaining parathyroid glands. No abnormal mediastinal
localization of tracer.
IMPRESSION: Positive parathyroid scan for parathyroid adenoma at the RIGHT
inferior parathyroid gland.
